# Patient Record
Sex: Female | Born: 1940 | Race: White | Hispanic: No | Smoking: Never smoker
Health system: Southern US, Community
[De-identification: ages and names within clinical notes are randomized; demographics above are authoritative.]

## PROBLEM LIST (undated history)

## (undated) DIAGNOSIS — I10 Essential (primary) hypertension: Secondary | ICD-10-CM

## (undated) DIAGNOSIS — E785 Hyperlipidemia, unspecified: Secondary | ICD-10-CM

## (undated) DIAGNOSIS — K219 Gastro-esophageal reflux disease without esophagitis: Secondary | ICD-10-CM

---

## 2015-09-09 DIAGNOSIS — Z23 Encounter for immunization: Secondary | ICD-10-CM | POA: Diagnosis not present

## 2015-11-14 DIAGNOSIS — K219 Gastro-esophageal reflux disease without esophagitis: Secondary | ICD-10-CM | POA: Diagnosis not present

## 2015-11-14 DIAGNOSIS — Z0000 Encounter for general adult medical examination without abnormal findings: Secondary | ICD-10-CM | POA: Diagnosis not present

## 2015-11-14 DIAGNOSIS — Z79899 Other long term (current) drug therapy: Secondary | ICD-10-CM | POA: Diagnosis not present

## 2015-11-14 DIAGNOSIS — K649 Unspecified hemorrhoids: Secondary | ICD-10-CM | POA: Diagnosis not present

## 2015-11-14 DIAGNOSIS — I10 Essential (primary) hypertension: Secondary | ICD-10-CM | POA: Diagnosis not present

## 2015-11-14 DIAGNOSIS — E785 Hyperlipidemia, unspecified: Secondary | ICD-10-CM | POA: Diagnosis not present

## 2016-01-05 DIAGNOSIS — Z1231 Encounter for screening mammogram for malignant neoplasm of breast: Secondary | ICD-10-CM | POA: Diagnosis not present

## 2016-03-10 DIAGNOSIS — J324 Chronic pansinusitis: Secondary | ICD-10-CM | POA: Diagnosis not present

## 2016-07-30 DIAGNOSIS — Z23 Encounter for immunization: Secondary | ICD-10-CM | POA: Diagnosis not present

## 2016-07-30 DIAGNOSIS — M810 Age-related osteoporosis without current pathological fracture: Secondary | ICD-10-CM | POA: Diagnosis not present

## 2016-07-30 DIAGNOSIS — I10 Essential (primary) hypertension: Secondary | ICD-10-CM | POA: Diagnosis not present

## 2016-07-30 DIAGNOSIS — Z1389 Encounter for screening for other disorder: Secondary | ICD-10-CM | POA: Diagnosis not present

## 2016-07-30 DIAGNOSIS — E785 Hyperlipidemia, unspecified: Secondary | ICD-10-CM | POA: Diagnosis not present

## 2016-07-30 DIAGNOSIS — Z9181 History of falling: Secondary | ICD-10-CM | POA: Diagnosis not present

## 2016-07-30 DIAGNOSIS — Z79899 Other long term (current) drug therapy: Secondary | ICD-10-CM | POA: Diagnosis not present

## 2016-11-02 DIAGNOSIS — R300 Dysuria: Secondary | ICD-10-CM | POA: Diagnosis not present

## 2016-11-02 DIAGNOSIS — K648 Other hemorrhoids: Secondary | ICD-10-CM | POA: Diagnosis not present

## 2016-11-02 DIAGNOSIS — N3091 Cystitis, unspecified with hematuria: Secondary | ICD-10-CM | POA: Diagnosis not present

## 2016-11-02 DIAGNOSIS — N3001 Acute cystitis with hematuria: Secondary | ICD-10-CM | POA: Diagnosis not present

## 2016-11-09 DIAGNOSIS — B9789 Other viral agents as the cause of diseases classified elsewhere: Secondary | ICD-10-CM | POA: Diagnosis not present

## 2016-11-09 DIAGNOSIS — J069 Acute upper respiratory infection, unspecified: Secondary | ICD-10-CM | POA: Diagnosis not present

## 2017-01-15 DIAGNOSIS — E785 Hyperlipidemia, unspecified: Secondary | ICD-10-CM | POA: Diagnosis not present

## 2017-01-15 DIAGNOSIS — Z79899 Other long term (current) drug therapy: Secondary | ICD-10-CM | POA: Diagnosis not present

## 2017-01-15 DIAGNOSIS — H6120 Impacted cerumen, unspecified ear: Secondary | ICD-10-CM | POA: Diagnosis not present

## 2017-01-15 DIAGNOSIS — I10 Essential (primary) hypertension: Secondary | ICD-10-CM | POA: Diagnosis not present

## 2017-01-15 DIAGNOSIS — Z0000 Encounter for general adult medical examination without abnormal findings: Secondary | ICD-10-CM | POA: Diagnosis not present

## 2017-01-18 DIAGNOSIS — Z1231 Encounter for screening mammogram for malignant neoplasm of breast: Secondary | ICD-10-CM | POA: Diagnosis not present

## 2017-03-05 DIAGNOSIS — Z6830 Body mass index (BMI) 30.0-30.9, adult: Secondary | ICD-10-CM | POA: Diagnosis not present

## 2017-03-05 DIAGNOSIS — K649 Unspecified hemorrhoids: Secondary | ICD-10-CM | POA: Diagnosis not present

## 2017-03-05 DIAGNOSIS — R3989 Other symptoms and signs involving the genitourinary system: Secondary | ICD-10-CM | POA: Diagnosis not present

## 2017-05-22 DIAGNOSIS — J329 Chronic sinusitis, unspecified: Secondary | ICD-10-CM | POA: Diagnosis not present

## 2017-05-22 DIAGNOSIS — J40 Bronchitis, not specified as acute or chronic: Secondary | ICD-10-CM | POA: Diagnosis not present

## 2017-05-22 DIAGNOSIS — Z6830 Body mass index (BMI) 30.0-30.9, adult: Secondary | ICD-10-CM | POA: Diagnosis not present

## 2017-06-02 DIAGNOSIS — R05 Cough: Secondary | ICD-10-CM | POA: Diagnosis not present

## 2017-06-02 DIAGNOSIS — L508 Other urticaria: Secondary | ICD-10-CM | POA: Diagnosis not present

## 2017-06-03 DIAGNOSIS — X58XXXA Exposure to other specified factors, initial encounter: Secondary | ICD-10-CM | POA: Diagnosis not present

## 2017-06-03 DIAGNOSIS — T7840XA Allergy, unspecified, initial encounter: Secondary | ICD-10-CM | POA: Diagnosis not present

## 2017-06-03 DIAGNOSIS — L500 Allergic urticaria: Secondary | ICD-10-CM | POA: Diagnosis not present

## 2017-06-04 DIAGNOSIS — L500 Allergic urticaria: Secondary | ICD-10-CM | POA: Diagnosis not present

## 2017-06-10 DIAGNOSIS — Z6831 Body mass index (BMI) 31.0-31.9, adult: Secondary | ICD-10-CM | POA: Diagnosis not present

## 2017-06-10 DIAGNOSIS — L509 Urticaria, unspecified: Secondary | ICD-10-CM | POA: Diagnosis not present

## 2017-08-09 DIAGNOSIS — Z79899 Other long term (current) drug therapy: Secondary | ICD-10-CM | POA: Diagnosis not present

## 2017-08-09 DIAGNOSIS — N39490 Overflow incontinence: Secondary | ICD-10-CM | POA: Diagnosis not present

## 2017-08-09 DIAGNOSIS — K219 Gastro-esophageal reflux disease without esophagitis: Secondary | ICD-10-CM | POA: Diagnosis not present

## 2017-08-09 DIAGNOSIS — Z23 Encounter for immunization: Secondary | ICD-10-CM | POA: Diagnosis not present

## 2017-08-09 DIAGNOSIS — Z6830 Body mass index (BMI) 30.0-30.9, adult: Secondary | ICD-10-CM | POA: Diagnosis not present

## 2017-08-09 DIAGNOSIS — E785 Hyperlipidemia, unspecified: Secondary | ICD-10-CM | POA: Diagnosis not present

## 2017-08-09 DIAGNOSIS — I10 Essential (primary) hypertension: Secondary | ICD-10-CM | POA: Diagnosis not present

## 2017-08-28 DIAGNOSIS — K219 Gastro-esophageal reflux disease without esophagitis: Secondary | ICD-10-CM | POA: Diagnosis not present

## 2017-08-28 DIAGNOSIS — R69 Illness, unspecified: Secondary | ICD-10-CM | POA: Diagnosis not present

## 2017-08-28 DIAGNOSIS — Z6830 Body mass index (BMI) 30.0-30.9, adult: Secondary | ICD-10-CM | POA: Diagnosis not present

## 2017-08-28 DIAGNOSIS — R3989 Other symptoms and signs involving the genitourinary system: Secondary | ICD-10-CM | POA: Diagnosis not present

## 2017-10-08 DIAGNOSIS — N390 Urinary tract infection, site not specified: Secondary | ICD-10-CM | POA: Diagnosis not present

## 2018-01-15 DIAGNOSIS — M1990 Unspecified osteoarthritis, unspecified site: Secondary | ICD-10-CM | POA: Diagnosis not present

## 2018-01-15 DIAGNOSIS — Z6831 Body mass index (BMI) 31.0-31.9, adult: Secondary | ICD-10-CM | POA: Diagnosis not present

## 2018-01-15 DIAGNOSIS — K649 Unspecified hemorrhoids: Secondary | ICD-10-CM | POA: Diagnosis not present

## 2018-01-20 DIAGNOSIS — Z1231 Encounter for screening mammogram for malignant neoplasm of breast: Secondary | ICD-10-CM | POA: Diagnosis not present

## 2018-01-28 DIAGNOSIS — R928 Other abnormal and inconclusive findings on diagnostic imaging of breast: Secondary | ICD-10-CM | POA: Diagnosis not present

## 2018-07-21 DIAGNOSIS — E669 Obesity, unspecified: Secondary | ICD-10-CM | POA: Diagnosis not present

## 2018-07-21 DIAGNOSIS — Z9181 History of falling: Secondary | ICD-10-CM | POA: Diagnosis not present

## 2018-07-21 DIAGNOSIS — Z1339 Encounter for screening examination for other mental health and behavioral disorders: Secondary | ICD-10-CM | POA: Diagnosis not present

## 2018-07-21 DIAGNOSIS — Z6832 Body mass index (BMI) 32.0-32.9, adult: Secondary | ICD-10-CM | POA: Diagnosis not present

## 2018-07-21 DIAGNOSIS — Z23 Encounter for immunization: Secondary | ICD-10-CM | POA: Diagnosis not present

## 2018-07-21 DIAGNOSIS — I10 Essential (primary) hypertension: Secondary | ICD-10-CM | POA: Diagnosis not present

## 2018-07-21 DIAGNOSIS — E785 Hyperlipidemia, unspecified: Secondary | ICD-10-CM | POA: Diagnosis not present

## 2018-07-21 DIAGNOSIS — Z0000 Encounter for general adult medical examination without abnormal findings: Secondary | ICD-10-CM | POA: Diagnosis not present

## 2018-07-21 DIAGNOSIS — Z1331 Encounter for screening for depression: Secondary | ICD-10-CM | POA: Diagnosis not present

## 2018-07-21 DIAGNOSIS — Z79899 Other long term (current) drug therapy: Secondary | ICD-10-CM | POA: Diagnosis not present

## 2018-07-21 DIAGNOSIS — J309 Allergic rhinitis, unspecified: Secondary | ICD-10-CM | POA: Diagnosis not present

## 2018-08-26 DIAGNOSIS — Z6831 Body mass index (BMI) 31.0-31.9, adult: Secondary | ICD-10-CM | POA: Diagnosis not present

## 2018-08-26 DIAGNOSIS — N390 Urinary tract infection, site not specified: Secondary | ICD-10-CM | POA: Diagnosis not present

## 2019-02-16 ENCOUNTER — Other Ambulatory Visit: Payer: Self-pay

## 2019-02-16 ENCOUNTER — Emergency Department (HOSPITAL_COMMUNITY)
Admission: EM | Admit: 2019-02-16 | Discharge: 2019-02-17 | Disposition: A | Discharge status: Home / Self Care | Payer: Medicare HMO | Attending: Emergency Medicine | Admitting: Emergency Medicine

## 2019-02-16 ENCOUNTER — Emergency Department (HOSPITAL_COMMUNITY): Payer: Medicare HMO

## 2019-02-16 ENCOUNTER — Encounter (HOSPITAL_COMMUNITY): Payer: Self-pay | Admitting: Emergency Medicine

## 2019-02-16 DIAGNOSIS — R0602 Shortness of breath: Secondary | ICD-10-CM | POA: Diagnosis not present

## 2019-02-16 DIAGNOSIS — I10 Essential (primary) hypertension: Secondary | ICD-10-CM | POA: Diagnosis not present

## 2019-02-16 DIAGNOSIS — Z7982 Long term (current) use of aspirin: Secondary | ICD-10-CM | POA: Insufficient documentation

## 2019-02-16 DIAGNOSIS — N3000 Acute cystitis without hematuria: Secondary | ICD-10-CM | POA: Diagnosis not present

## 2019-02-16 DIAGNOSIS — Z79899 Other long term (current) drug therapy: Secondary | ICD-10-CM | POA: Insufficient documentation

## 2019-02-16 HISTORY — DX: Hyperlipidemia, unspecified: E78.5

## 2019-02-16 HISTORY — DX: Gastro-esophageal reflux disease without esophagitis: K21.9

## 2019-02-16 HISTORY — DX: Essential (primary) hypertension: I10

## 2019-02-16 LAB — BASIC METABOLIC PANEL
Anion gap: 13 (ref 5–15)
BUN: 11 mg/dL (ref 8–23)
CO2: 22 mmol/L (ref 22–32)
Calcium: 9.6 mg/dL (ref 8.9–10.3)
Chloride: 102 mmol/L (ref 98–111)
Creatinine, Ser: 0.8 mg/dL (ref 0.44–1.00)
GFR calc Af Amer: >60 mL/min (ref >60)
GFR calc non Af Amer: >60 mL/min (ref >60)
Glucose, Bld: 137 mg/dL — ABNORMAL HIGH (ref 70–99)
Potassium: 3.7 mmol/L (ref 3.5–5.1)
Sodium: 137 mmol/L (ref 135–145)

## 2019-02-16 LAB — CBC
HCT: 45.4 % (ref 36.0–46.0)
Hemoglobin: 14.2 g/dL (ref 12.0–15.0)
MCH: 27.7 pg (ref 26.0–34.0)
MCHC: 31.3 g/dL (ref 30.0–36.0)
MCV: 88.7 fL (ref 80.0–100.0)
Platelets: 228 10*3/uL (ref 150–400)
RBC: 5.12 MIL/uL — ABNORMAL HIGH (ref 3.87–5.11)
RDW: 13 % (ref 11.5–15.5)
WBC: 11.1 10*3/uL — ABNORMAL HIGH (ref 4.0–10.5)
nRBC: 0 % (ref 0.0–0.2)

## 2019-02-16 LAB — TROPONIN I: Troponin I: <0.03 ng/mL (ref <0.03)

## 2019-02-16 MED ORDER — SODIUM CHLORIDE 0.9% FLUSH: 3.0000 mL | Freq: Once | INTRAVENOUS | Status: DC

## 2019-02-16 NOTE — ED Notes (Signed)
Patient transported to X-ray 

## 2019-02-16 NOTE — ED Provider Notes (Signed)
[place] EMERGENCY DEPARTMENT Provider Note   CSN: [id number] Arrival date & time: 02/16/19  2224    History   Chief Complaint Chief Complaint  Patient presents with  . Shortness of Breath    HPI Diana Aguilar is a 78 y.o. female with a h/o of HTN, HLD, and GERD who presents to the Emergency Department with a chief complaint of shortness of breath.   The patient reports she developed shortness of breath around 15:00 that lasted for several hours. Dyspnea began while she was sitting on the couch. No known aggravating or alleviating factors. She reports the shortness of breath has resolved. She reports she had nausea that began around 11:00 and lasted for a couple of hours before resolving spontaneously without treatment. She reports she has been having some "discomfort" in her back that began either last night or sometime this morning. She is unable to characterize the pain. Pain is non-radiating. No known aggravating or alleviating factors.   She reports she lives in [place] with her son that works at [employer], but has been staying with her son in [place] during the COVID-19 pandemic.    The patient's son, Diana Aguilar, reports his mother has been living with her for the last 4 weeks. He works in [employer], applications and has been working from home for the last 4 weeks. He reports he has checked her temperature daily for the last month, which has been 99 F.   He reports that his mother told him that she was feeling nauseated around 17:50. He reports that he gave her some OTC nausea medication recommended by the pharmacist and her nausea resolved. He reports that at 22:00 she began endorsing shortness of breath and states "I'm having trouble breathing." He also reports she was endorsing pain in her back and shoulder. He checked her pulse, which was elevated. He also noted her color "looked bad." He reports he was concerned because she never complains.   She quit smoking 30  years ago. She does not use alcohol. He reports she saw a cardiologist ~10 years ago after going to her PCP and had a normal EKG. She was sent home with NTG and follow up to cardiology. She followed up with cardiology and had a normal work up and abnormal EKG was presumed to be due to incorrect lead placement. Her son reports that the patient's sisters have CVD. He reports that the patient was being worked up for dementia, but her appointment was cancelled due to COVID-19.       The history is provided by the patient. No language interpreter was used.    Past Medical History:  Diagnosis Date  . GERD (gastroesophageal reflux disease)   . Hyperlipidemia   . Hypertension     There are no active problems to display for this patient.   History reviewed. No pertinent surgical history.   OB History   No obstetric history on file.      Home Medications    Prior to Admission medications   Medication Sig Start Date End Date Taking? Authorizing Provider  amLODipine (NORVASC) 5 MG tablet Take 5 mg by mouth daily. 01/30/19  Yes [provider]  aspirin EC 81 MG tablet Take 81 mg by mouth daily.   Yes [provider]  benazepril (LOTENSIN) 40 MG tablet Take 40 mg by mouth daily. 01/30/19  Yes [provider]  omega-3 acid ethyl esters (LOVAZA) 1 g capsule Take 2 g by mouth daily.  Yes [provider]  omeprazole (PRILOSEC) 20 MG capsule Take 20 mg by mouth daily. 02/05/19  Yes [provider]  pravastatin (PRAVACHOL) 20 MG tablet Take 20 mg by mouth every morning. 01/25/19  Yes [provider]  cephALEXin (KEFLEX) 500 MG capsule Take 1 capsule (500 mg total) by mouth 2 (two) times daily for 5 days. 02/17/19 02/22/19  McDonald, Mia A, PA-C  ondansetron (ZOFRAN ODT) 4 MG disintegrating tablet Take 1 tablet (4 mg total) by mouth every 8 (eight) hours as needed. 02/17/19   McDonald, Mia A, PA-C    Family History No family history on file.   Social History Social History   Tobacco Use  . Smoking status: Never Smoker  . Smokeless tobacco: Never Used  Substance Use Topics  . Alcohol use: Never    Frequency: Never  . Drug use: Never     Allergies   Sulfa antibiotics   Review of Systems Review of Systems  Constitutional: Negative for activity change, chills, fatigue and fever.  HENT: Negative for congestion, rhinorrhea and sore throat.   Eyes: Negative for visual disturbance.  Respiratory: Positive for shortness of breath. Negative for cough and wheezing.   Cardiovascular: Negative for chest pain.  Gastrointestinal: Positive for nausea. Negative for abdominal pain, diarrhea and vomiting.  Genitourinary: Negative for dysuria, frequency and urgency.  Musculoskeletal: Positive for back pain. Negative for myalgias, neck pain and neck stiffness.  Skin: Negative for rash.  Allergic/Immunologic: Negative for immunocompromised state.  Neurological: Negative for dizziness, weakness, light-headedness and headaches.  Psychiatric/Behavioral: Negative for confusion.   Physical Exam Updated Vital Signs BP (!) 157/85   Pulse (!) 126   Temp 98.7 F (37.1 C) (Rectal)   Resp (!) 21   SpO2 98%   Physical Exam Vitals signs and nursing note reviewed.  Constitutional:      General: She is not in acute distress. HENT:     Head: Normocephalic.  Eyes:     Conjunctiva/sclera: Conjunctivae normal.  Neck:     Musculoskeletal: Neck supple.  Cardiovascular:     Rate and Rhythm: Normal rate and regular rhythm.     Heart sounds: No murmur. No friction rub. No gallop.   Pulmonary:     Effort: Pulmonary effort is normal. No tachypnea, bradypnea, accessory muscle usage or respiratory distress.     Breath sounds: No stridor. No decreased breath sounds, wheezing, rhonchi or rales.     Comments: No increased work of breathing.  Chest:     Chest wall: No deformity, tenderness or edema.  Abdominal:     General: There is no distension.      Palpations: Abdomen is soft. There is no mass.     Tenderness: There is no abdominal tenderness. There is no right CVA tenderness, left CVA tenderness, guarding or rebound.     Hernia: No hernia is present.     Comments: Abdomen is soft, nontender, nondistended. No rebound or guarding. No CVA tenderness bilaterally.   Musculoskeletal:     Right lower leg: She exhibits no tenderness. No edema.     Left lower leg: She exhibits no tenderness. No edema.     Comments: No tenderness to the cervical, thoracic, or lumbar spinous processes or bilateral paraspinal muscles.  No crepitus or step-offs. No rashes or wounds.   Skin:    General: Skin is warm.     Findings: No rash.  Neurological:     Mental Status: She is alert.  Psychiatric:  Behavior: Behavior normal.      ED Treatments / Results  Labs (all labs ordered are listed, but only abnormal results are displayed) Labs Reviewed  BASIC METABOLIC PANEL - Abnormal; Notable for the following components:      Result Value   Glucose, Bld 137 (*)    All other components within normal limits  CBC - Abnormal; Notable for the following components:   WBC 11.1 (*)    RBC 5.12 (*)    All other components within normal limits  URINALYSIS, ROUTINE W REFLEX MICROSCOPIC - Abnormal; Notable for the following components:   APPearance HAZY (*)    Leukocytes,Ua MODERATE (*)    Bacteria, UA FEW (*)    All other components within normal limits  URINE CULTURE  TROPONIN I  HEPATIC FUNCTION PANEL  D-DIMER, QUANTITATIVE (NOT AT [facility])  TROPONIN I    EKG EKG Interpretation  Date/Time:  Monday February 16 2019 21:32:52 EDT Ventricular Rate:  122 PR Interval:  126 QRS Duration: 76 QT Interval:  322 QTC Calculation: 458 R Axis:   74 Text Interpretation:  Sinus tachycardia ST & T wave abnormality, consider inferior ischemia Abnormal ECG No old tracing to compare Confirmed by Ward, Cyril Mourning [phone]) on 02/16/2019 11:32:41 PM   Radiology Dg Chest  2 View  Result Date: 02/16/2019 CLINICAL DATA:  Increasing shortness of breath for several hours EXAM: CHEST - 2 VIEW COMPARISON:  08/04/2012 FINDINGS: Cardiac shadow is within normal limits. Aortic calcifications are again seen. The lungs are well aerated bilaterally. No focal infiltrate or sizable effusion is seen. No acute bony abnormality is noted. IMPRESSION: No active cardiopulmonary disease. Electronically Signed   By: Inez Catalina M.D.   On: 02/16/2019 23:15    Procedures Procedures (including critical care time)  Medications Ordered in ED Medications - No data to display   Initial Impression / Assessment and Plan / ED Course  I have reviewed the triage vital signs and the nursing notes.  Pertinent labs & imaging results that were available during my care of the patient were reviewed by me and considered in my medical decision making (see chart for details).  78 year old female with a history of hypertension, hyperlipidemia, and GERD presenting with shortness of breath, nausea, and back pain.  On exam, the patient has no increased work of breathing, accessory muscle use, or tachypnea.  SaO2 is in the high 90s with good waveform on the monitor.  She is tachycardic in the 120s, but vital signs are otherwise reassuring.  No fever with rectal temp.  The patient was discussed and evaluated with Dr. Leonides Schanz, attending physician.  Will order a chest x-ray, labs, EKG, urinalysis, and reassess.  EKG with sinus tachycardia.  Initial troponin is negative.  CBC with mild leukocytosis.  Urinalysis is concerning for infection.  Urine culture sent.  Electrolytes are reassuring.  Chest x-ray is unremarkable.  D-dimer was negative.  Clinical Course as of Feb 16 738  Tue Feb 17, 2019  0104 Notified by nursing staff that the patient was requesting to leave because "no one had been in the room to check on her."  Return to the patient's room to reevaluate her for the third time.  Reminded her that both  myself and Dr. Leonides Schanz have been checking on her.  The patient's work-up thus far was reviewed with the patient.  Discussed with the patient that her back pain may be due to a UTI and a urine culture had been sent.  We will  plan to discharge her with antibiotics.  Discussed that we are waiting on a repeat troponin that have previously been discussed with her son Diana Aguilar and encouraged her to stay for repeat troponin.  She is agreeable to the plan at this time.  Heart rate has improved to low 100s without treatment. BP has also improved to 130s/70s without treatment.    [MM]    Clinical Course User Index [MM] McDonald, Mia A, PA-C    The patient's work-up was discussed with her son, Diana Aguilar, after receiving permission from the patient.  Low suspicion for ACS, PE, pneumothorax, esophageal rupture, myocarditis, pericarditis, cholecystitis, or pneumonia. Although heart rate improved, it did elevate into the 110s when the patient stood and was waiting at the door of her room prior to discharge.  I offered the patient an IV fluid bolus as I suspect she may have some mild dehydration although she has not endorsed any dizziness or lightheadedness, but she declined.  She is tolerating p.o. fluids so it is reasonable for her to fluid resuscitate at home.  There also appears to be a component of anxiety state related to her elevated heart rate as she has adamantly expressed that she gets anxious when going to the ER or to a healthcare appointment.  The patient continues to remain symptom-free.  Will discharge with Keflex for UTI.  Strict return precautions given.  She is hemodynamically stable and in no acute distress.  Safe for discharge home with outpatient follow-up this time.     Final Clinical Impressions(s) / ED Diagnoses   Final diagnoses:  Acute cystitis without hematuria  Shortness of breath    ED Discharge Orders         Ordered    cephALEXin (KEFLEX) 500 MG capsule  2 times daily     02/17/19 0210     ondansetron (ZOFRAN ODT) 4 MG disintegrating tablet  Every 8 hours PRN     02/17/19 0216           McDonald, Mia A, PA-C 02/17/19 0739    Ward, Delice Bison, DO 02/17/19 2323

## 2019-02-16 NOTE — ED Triage Notes (Signed)
Patient with increased shortness of breath that started 3pm this afternoon.  She denies any chest pain, nausea or vomiting, no fevers per patient.

## 2019-02-17 DIAGNOSIS — Z7982 Long term (current) use of aspirin: Secondary | ICD-10-CM | POA: Diagnosis not present

## 2019-02-17 DIAGNOSIS — I10 Essential (primary) hypertension: Secondary | ICD-10-CM | POA: Diagnosis not present

## 2019-02-17 DIAGNOSIS — N3000 Acute cystitis without hematuria: Secondary | ICD-10-CM | POA: Diagnosis not present

## 2019-02-17 DIAGNOSIS — Z79899 Other long term (current) drug therapy: Secondary | ICD-10-CM | POA: Diagnosis not present

## 2019-02-17 LAB — URINALYSIS, ROUTINE W REFLEX MICROSCOPIC
Bilirubin Urine: NEGATIVE
Glucose, UA: NEGATIVE mg/dL
Hgb urine dipstick: NEGATIVE
Ketones, ur: NEGATIVE mg/dL
Nitrite: NEGATIVE
Protein, ur: NEGATIVE mg/dL
Specific Gravity, Urine: 1.018 (ref 1.005–1.030)
pH: 6 (ref 5.0–8.0)

## 2019-02-17 LAB — HEPATIC FUNCTION PANEL
ALT: 21 U/L (ref 0–44)
AST: 27 U/L (ref 15–41)
Albumin: 4.4 g/dL (ref 3.5–5.0)
Alkaline Phosphatase: 99 U/L (ref 38–126)
Bilirubin, Direct: 0.1 mg/dL (ref 0.0–0.2)
Indirect Bilirubin: 0.5 mg/dL (ref 0.3–0.9)
Total Bilirubin: 0.6 mg/dL (ref 0.3–1.2)
Total Protein: 8.1 g/dL (ref 6.5–8.1)

## 2019-02-17 LAB — D-DIMER, QUANTITATIVE: D-Dimer, Quant: 0.43 ug/mL-FEU (ref 0.00–0.50)

## 2019-02-17 LAB — TROPONIN I: Troponin I: <0.03 ng/mL (ref <0.03)

## 2019-02-17 MED ORDER — ONDANSETRON 4 MG PO TBDP: 4.0000 mg | ORAL_TABLET | Freq: Three times a day (TID) | ORAL | 0 refills | Status: AC | PRN

## 2019-02-17 MED ORDER — CEPHALEXIN 500 MG PO CAPS: 500.0000 mg | ORAL_CAPSULE | Freq: Two times a day (BID) | ORAL | 0 refills | Status: AC

## 2019-02-17 NOTE — Discharge Instructions (Addendum)
Thank you for allowing me to care for you today in the Emergency Department.   Your work-up today for shortness of breath was reassuring.  Your EKG showed that your heart rate was fast and this resolved in the ER without treatment.  Your oxygen saturation stayed at 98 to 99% in the ER. Use the activation code to download My Chart to review your results and share with your primary care provider as needed.   Your chest x-ray was negative.  Your labs were concerning for a urinary tract infection.  I have given you a prescription for Keflex.  Take this antibiotic by mouth 2 times daily for the next 5 days.  Your urine has been sent to the lab for culture.  This could explain the back pain and nausea that you have been having.  If your nausea returns, you can let 1 tablet of Zofran dissolve in your tongue every 8 hours as needed for nausea or vomiting.  If you develop shortness of breath at home with wheezing you can try taking 2 puffs of an albuterol inhaler once every 4 hours.   Please follow-up with your primary care provider within the next week regarding your visit to the ER.  Although your work-up is reassuring today, if you develop new or worsening symptoms including worsening shortness of breath, respiratory distress, shortness of breath with chest pain, neck pain, or pain radiating down the left arm, sweating, a bad cough, fever, significant swelling in her legs, or other new concerning symptoms, please return to the emergency department for re-evaluation.

## 2019-02-17 NOTE — ED Notes (Signed)
Pt requesting beverage and requesting to be discharged.

## 2019-02-18 LAB — URINE CULTURE

## 2019-05-07 DIAGNOSIS — Z6832 Body mass index (BMI) 32.0-32.9, adult: Secondary | ICD-10-CM | POA: Diagnosis not present

## 2019-05-07 DIAGNOSIS — R69 Illness, unspecified: Secondary | ICD-10-CM | POA: Diagnosis not present

## 2019-06-18 ENCOUNTER — Emergency Department (HOSPITAL_COMMUNITY): Payer: Medicare HMO

## 2019-06-18 ENCOUNTER — Other Ambulatory Visit: Payer: Self-pay

## 2019-06-18 ENCOUNTER — Emergency Department (HOSPITAL_COMMUNITY)
Admission: EM | Admit: 2019-06-18 | Discharge: 2019-06-18 | Disposition: A | Discharge status: Home / Self Care | Payer: Medicare HMO | Attending: Emergency Medicine | Admitting: Emergency Medicine

## 2019-06-18 DIAGNOSIS — S199XXA Unspecified injury of neck, initial encounter: Secondary | ICD-10-CM | POA: Diagnosis not present

## 2019-06-18 DIAGNOSIS — Z79899 Other long term (current) drug therapy: Secondary | ICD-10-CM | POA: Diagnosis not present

## 2019-06-18 DIAGNOSIS — I10 Essential (primary) hypertension: Secondary | ICD-10-CM | POA: Diagnosis not present

## 2019-06-18 DIAGNOSIS — S0003XA Contusion of scalp, initial encounter: Secondary | ICD-10-CM | POA: Insufficient documentation

## 2019-06-18 DIAGNOSIS — Y9301 Activity, walking, marching and hiking: Secondary | ICD-10-CM | POA: Insufficient documentation

## 2019-06-18 DIAGNOSIS — Y92009 Unspecified place in unspecified non-institutional (private) residence as the place of occurrence of the external cause: Secondary | ICD-10-CM | POA: Insufficient documentation

## 2019-06-18 DIAGNOSIS — G3184 Mild cognitive impairment, so stated: Secondary | ICD-10-CM | POA: Diagnosis not present

## 2019-06-18 DIAGNOSIS — Y999 Unspecified external cause status: Secondary | ICD-10-CM | POA: Diagnosis not present

## 2019-06-18 DIAGNOSIS — S50311A Abrasion of right elbow, initial encounter: Secondary | ICD-10-CM | POA: Diagnosis not present

## 2019-06-18 DIAGNOSIS — W109XXA Fall (on) (from) unspecified stairs and steps, initial encounter: Secondary | ICD-10-CM | POA: Insufficient documentation

## 2019-06-18 DIAGNOSIS — Z87891 Personal history of nicotine dependence: Secondary | ICD-10-CM | POA: Diagnosis not present

## 2019-06-18 DIAGNOSIS — Z7982 Long term (current) use of aspirin: Secondary | ICD-10-CM | POA: Insufficient documentation

## 2019-06-18 DIAGNOSIS — S0101XA Laceration without foreign body of scalp, initial encounter: Secondary | ICD-10-CM | POA: Diagnosis not present

## 2019-06-18 NOTE — ED Notes (Signed)
Pa  at bedside. 

## 2019-06-18 NOTE — ED Triage Notes (Signed)
Pt reports tripping when going down her front steps. Pt reports falling and hitting her head on concrete. Bleeding controlled to rt side of head approx 2cm laceration to area. Pt alert and oriented denies LOC. Fall was from 1 step.

## 2019-06-18 NOTE — ED Provider Notes (Signed)
[facility] EMERGENCY DEPARTMENT Provider Note   CSN: [id number] Arrival date & time: 06/18/19  1233    History   Chief Complaint Chief Complaint  Patient presents with   Fall    HPI Diana Aguilar is a 78 y.o. female with PMH HTN, HLD, GERD.  History provided by patient and her daughter.  Patient fell going up the front step at her son's home today around 12pm. She notes that she hit the right side of her head on the concrete.  Notes that she did not lose consciousness.  Denies pain in any other areas.  Denies changes in vision, headache.  States that she was able to ambulate into the emergency room today without difficulty.  Prior to fall, patient was in her normal state of health and without complaints.   Past Medical History:  Diagnosis Date   GERD (gastroesophageal reflux disease)    Hyperlipidemia    Hypertension     There are no active problems to display for this patient.   No past surgical history on file.   OB History   No obstetric history on file.      Home Medications    Prior to Admission medications   Medication Sig Start Date End Date Taking? Authorizing Provider  amLODipine (NORVASC) 5 MG tablet Take 5 mg by mouth daily. 01/30/19   [provider]  aspirin EC 81 MG tablet Take 81 mg by mouth daily.    [provider]  benazepril (LOTENSIN) 40 MG tablet Take 40 mg by mouth daily. 01/30/19   [provider]  omega-3 acid ethyl esters (LOVAZA) 1 g capsule Take 2 g by mouth daily.    [provider]  omeprazole (PRILOSEC) 20 MG capsule Take 20 mg by mouth daily. 02/05/19   [provider]  ondansetron (ZOFRAN ODT) 4 MG disintegrating tablet Take 1 tablet (4 mg total) by mouth every 8 (eight) hours as needed. 02/17/19   McDonald, Mia A, PA-C  pravastatin (PRAVACHOL) 20 MG tablet Take 20 mg by mouth every morning. 01/25/19   [provider]    Family History No family history on  file.  Social History Social History   Tobacco Use   Smoking status: Never Smoker   Smokeless tobacco: Never Used  Substance Use Topics   Alcohol use: Never    Frequency: Never   Drug use: Never     Allergies   Sulfa antibiotics   Review of Systems Review of Systems  Constitutional: Negative for activity change, chills, fatigue and fever.  HENT: Negative for congestion and sore throat.   Eyes: Negative for visual disturbance.  Respiratory: Negative for cough and shortness of breath.   Cardiovascular: Negative for chest pain and leg swelling.  Gastrointestinal: Negative for abdominal pain, blood in stool, constipation, diarrhea, nausea and vomiting.  Genitourinary: Negative for difficulty urinating, dysuria and enuresis.  Musculoskeletal: Negative for back pain, neck pain and neck stiffness.  Skin: Positive for wound.  Neurological: Negative for dizziness, syncope, weakness and headaches.  Psychiatric/Behavioral: Negative for confusion.     Physical Exam Updated Vital Signs BP 138/66 (BP Location: Right Arm)    Pulse 81    Temp 98.1 F (36.7 C) (Oral)    Resp 18    SpO2 99%   Physical Exam Constitutional:      General: She is not in acute distress.    Appearance: Normal appearance. She is not ill-appearing.  HENT:     Head:  Eyes:     Extraocular Movements: Extraocular movements intact.     Conjunctiva/sclera: Conjunctivae normal.     Pupils: Pupils are equal, round, and reactive to light.  Neck:     Musculoskeletal: Normal range of motion and neck supple. No neck rigidity or muscular tenderness.  Cardiovascular:     Rate and Rhythm: Normal rate and regular rhythm.     Pulses: Normal pulses.     Heart sounds: No murmur. No friction rub. No gallop.   Pulmonary:     Effort: Pulmonary effort is normal. No respiratory distress.     Breath sounds: Normal breath sounds. No wheezing, rhonchi or rales.  Abdominal:     General: Abdomen is flat. Bowel sounds are  normal. There is no distension.     Palpations: Abdomen is soft.     Tenderness: There is no abdominal tenderness.  Musculoskeletal: Normal range of motion.        General: No swelling or tenderness.     Right lower leg: No edema.     Left lower leg: No edema.     Comments: Full strength BUE/BLE, small abrasion on right elbow, no TTP of all BUE/BLE  Skin:    General: Skin is warm and dry.  Neurological:     General: No focal deficit present.     Mental Status: She is alert and oriented to person, place, and time. Mental status is at baseline.     Cranial Nerves: No cranial nerve deficit.     Sensory: No sensory deficit.     Motor: No weakness.     Gait: Gait normal.     Comments: Sensation intact throughout  Psychiatric:        Mood and Affect: Mood normal.        Behavior: Behavior normal.      ED Treatments / Results  Labs (all labs ordered are listed, but only abnormal results are displayed) Labs Reviewed - No data to display  EKG None  Radiology Ct Head Wo Contrast  Result Date: 06/18/2019 CLINICAL DATA:  Trip with fall down front steps, head strike to concrete right scalp laceration EXAM: CT HEAD WITHOUT CONTRAST CT CERVICAL SPINE WITHOUT CONTRAST TECHNIQUE: Multidetector CT imaging of the head and cervical spine was performed following the standard protocol without intravenous contrast. Multiplanar CT image reconstructions of the cervical spine were also generated. COMPARISON:  None. FINDINGS: CT HEAD FINDINGS Brain: No evidence of acute infarction, hemorrhage, hydrocephalus, extra-axial collection.There is a focally sclerotic appearing lesion arising from the inner table of the squamosal portion of the right temporal bone which exerts minimal mass effect on the adjacent brain. Vascular: Atherosclerotic calcification of the carotid siphons and intradural vertebral arteries. Skull: There is focal scalp swelling and laceration with subgaleal hematoma measuring up to 6 mm in  thickness overlying the right frontoparietal scalp without subjacent calvarial fracture. Appearance is suggestive of a meningioma. Background of hyperostosis frontalis interna within the calvaria Sinuses/Orbits: Paranasal sinuses and mastoid air cells are predominantly clear. Orbital structures are unremarkable. Other: None. CT CERVICAL SPINE FINDINGS Alignment: Exaggeration of the normal cervical lordosis. Degenerative anterolisthesis of C4 on C5, C6 on C7 and C7 on T1. Skull base and vertebrae: No acute fracture. No primary bone lesion or focal pathologic process. Vertebral body heights are maintained. Soft tissues and spinal canal: No pre or paravertebral fluid or swelling. No visible canal hematoma. Disc levels: Diffuse intervertebral disc height loss is present through the cervical spine. There resulting cervical  spondylitic endplate changes and hypertrophic facet degeneration. Posterior disc osteophyte complexes are present at C3-4, C5-6 and C6-7 which efface the ventral thecal sac. Osteophyte complex at C5-6 results in at least mild spinal canal stenosis. No other significant spinal canal stenosis. There is severe left and moderate right foraminal stenosis at C3-4 with additional mild-to-moderate foraminal stenoses are noted the remaining cervical levels. Upper chest: Mild biapical pleuroparenchymal scarring. No acute abnormality in the upper chest or imaged lung apices. Other: Vascular calcifications of the great vessels and within the carotid bifurcations. IMPRESSION: 1. No acute intracranial abnormality. 2. Focal scalp swelling and laceration with subgaleal hematoma overlying the right frontoparietal scalp without subjacent calvarial fracture. 3. Fully calcified lesion along the inner table of the squamosal portion of the right temporal bone likely reflects a calcified meningioma with minimal local mass effect. Findings most compatible with a meningioma. 4. No acute cervical spine fracture. 5. Multilevel  degenerative changes through the cervical spine as described above. Maximal changes at C3-4 with severe left neural foraminal stenosis. Electronically Signed   By: Lovena Le M.D.   On: 06/18/2019 15:49   Ct Cervical Spine Wo Contrast  Result Date: 06/18/2019 CLINICAL DATA:  Trip with fall down front steps, head strike to concrete right scalp laceration EXAM: CT HEAD WITHOUT CONTRAST CT CERVICAL SPINE WITHOUT CONTRAST TECHNIQUE: Multidetector CT imaging of the head and cervical spine was performed following the standard protocol without intravenous contrast. Multiplanar CT image reconstructions of the cervical spine were also generated. COMPARISON:  None. FINDINGS: CT HEAD FINDINGS Brain: No evidence of acute infarction, hemorrhage, hydrocephalus, extra-axial collection.There is a focally sclerotic appearing lesion arising from the inner table of the squamosal portion of the right temporal bone which exerts minimal mass effect on the adjacent brain. Vascular: Atherosclerotic calcification of the carotid siphons and intradural vertebral arteries. Skull: There is focal scalp swelling and laceration with subgaleal hematoma measuring up to 6 mm in thickness overlying the right frontoparietal scalp without subjacent calvarial fracture. Appearance is suggestive of a meningioma. Background of hyperostosis frontalis interna within the calvaria Sinuses/Orbits: Paranasal sinuses and mastoid air cells are predominantly clear. Orbital structures are unremarkable. Other: None. CT CERVICAL SPINE FINDINGS Alignment: Exaggeration of the normal cervical lordosis. Degenerative anterolisthesis of C4 on C5, C6 on C7 and C7 on T1. Skull base and vertebrae: No acute fracture. No primary bone lesion or focal pathologic process. Vertebral body heights are maintained. Soft tissues and spinal canal: No pre or paravertebral fluid or swelling. No visible canal hematoma. Disc levels: Diffuse intervertebral disc height loss is present  through the cervical spine. There resulting cervical spondylitic endplate changes and hypertrophic facet degeneration. Posterior disc osteophyte complexes are present at C3-4, C5-6 and C6-7 which efface the ventral thecal sac. Osteophyte complex at C5-6 results in at least mild spinal canal stenosis. No other significant spinal canal stenosis. There is severe left and moderate right foraminal stenosis at C3-4 with additional mild-to-moderate foraminal stenoses are noted the remaining cervical levels. Upper chest: Mild biapical pleuroparenchymal scarring. No acute abnormality in the upper chest or imaged lung apices. Other: Vascular calcifications of the great vessels and within the carotid bifurcations. IMPRESSION: 1. No acute intracranial abnormality. 2. Focal scalp swelling and laceration with subgaleal hematoma overlying the right frontoparietal scalp without subjacent calvarial fracture. 3. Fully calcified lesion along the inner table of the squamosal portion of the right temporal bone likely reflects a calcified meningioma with minimal local mass effect. Findings most compatible with a meningioma.  4. No acute cervical spine fracture. 5. Multilevel degenerative changes through the cervical spine as described above. Maximal changes at C3-4 with severe left neural foraminal stenosis. Electronically Signed   By: Lovena Le M.D.   On: 06/18/2019 15:49    Procedures .Marland KitchenLaceration Repair  Date/Time: 06/18/2019 4:44 PM Performed by: Cleophas Dunker, DO Authorized by: Drenda Freeze, MD   Consent:    Consent obtained:  Verbal   Consent given by:  Patient   Risks discussed:  Infection, pain, poor wound healing and need for additional repair   Alternatives discussed:  No treatment Anesthesia (see MAR for exact dosages):    Anesthesia method:  None Laceration details:    Location:  Scalp   Scalp location:  R parietal   Length (cm):  1.5   Depth (mm):  3 Repair type:    Repair type:   Simple Pre-procedure details:    Preparation:  Patient was prepped and draped in usual sterile fashion Exploration:    Wound exploration: wound explored through full range of motion     Wound extent: no fascia violation noted, no foreign bodies/material noted, no muscle damage noted and no underlying fracture noted     Contaminated: no   Treatment:    Area cleansed with:  Saline and Betadine   Amount of cleaning:  Standard   Irrigation solution:  Sterile saline   Irrigation volume:  15 cc   Irrigation method:  Syringe Skin repair:    Repair method:  Staples   Number of staples:  2 Approximation:    Approximation:  Close Post-procedure details:    Dressing:  Open (no dressing)   Patient tolerance of procedure:  Tolerated well, no immediate complications   (including critical care time)  Medications Ordered in ED Medications - No data to display   Initial Impression / Assessment and Plan / ED Course  I have reviewed the triage vital signs and the nursing notes.  Pertinent labs & imaging results that were available during my care of the patient were reviewed by me and considered in my medical decision making (see chart for details).  Apollonia Amini is an 78 yo female with PMH HTN. HLD, GERD, who presents after mechanical fall with right 1.5 cm head laceration and underlying hematoma.  Vital signs are stable, she is neurologically intact on exam.  She has no evidence of injury to other locations that requires further imaging and was without complaint at present.  Given 1.5 cm head laceration, placed to staples to prevent further opening and bleeding.  CT head without evidence of intracranial hemorrhage.  Did note meningioma, patient and her daughter made aware of this diagnosis and advised to follow-up with neurosurgery.  Referral placed on discharge.  Given she has otherwise been in good health, history is consistent with mechanical fall, and she is neurologically intact, no further  work-up indicated.  Return precautions discussed with patient and her daughter, they voiced understanding.  Advised to follow-up in 1 week with her PCP for staple removal.  Final Clinical Impressions(s) / ED Diagnoses   Final diagnoses:  Laceration of scalp without foreign body, initial encounter    ED Discharge Orders         Ordered    Ambulatory referral to Neurosurgery     06/18/19 [address] DO 06/18/19 1646    Drenda Freeze, MD 06/20/19 [phone]

## 2019-06-18 NOTE — Discharge Instructions (Signed)
You were seen in the hospital for a fall and head laceration.  2 staples were placed in your head.  You should see your primary doctor in 1 week to have these removed.  Keep the area clean and dry.  Use the information below.  There was also an area concerning for a meningioma on your CT scan.  We will have you follow-up with neurosurgery to see if anything else needs to be done.  Please ensure that you call them to make an appointment.

## 2019-06-18 NOTE — ED Notes (Signed)
Patient transported to CT 

## 2019-06-25 ENCOUNTER — Other Ambulatory Visit: Payer: Self-pay

## 2019-06-25 ENCOUNTER — Encounter (HOSPITAL_COMMUNITY): Payer: Self-pay | Admitting: *Deleted

## 2019-06-25 ENCOUNTER — Emergency Department (HOSPITAL_COMMUNITY)
Admission: EM | Admit: 2019-06-25 | Discharge: 2019-06-25 | Disposition: A | Discharge status: Home / Self Care | Payer: Medicare HMO | Attending: Emergency Medicine | Admitting: Emergency Medicine

## 2019-06-25 DIAGNOSIS — S0181XD Laceration without foreign body of other part of head, subsequent encounter: Secondary | ICD-10-CM | POA: Diagnosis not present

## 2019-06-25 DIAGNOSIS — Z4802 Encounter for removal of sutures: Secondary | ICD-10-CM | POA: Diagnosis not present

## 2019-06-25 NOTE — ED Triage Notes (Signed)
Pt needs staple removal (2) on her head, denies any issues with staples

## 2019-06-25 NOTE — ED Provider Notes (Signed)
[facility] EMERGENCY DEPARTMENT Provider Note   CSN: [id number] Arrival date & time: 06/25/19  2002     History   Chief Complaint Chief Complaint  Patient presents with  . Suture / Staple Removal    HPI Diana Aguilar is a 78 y.o. female.     78 y.o female with a PMH of GERD, HTN presents to the ED with request of staple removal.  Patient had a fall about a week ago, had a unremarkable work-up while in the ED.  Received 2 staples to her head, she now returns for removal.  Patient reports no issues with the wound.  Denies any fevers, erythema, drainage from the wound.  The history is provided by the patient and a relative.  Suture / Staple Removal Pertinent negatives include no headaches.    Past Medical History:  Diagnosis Date  . GERD (gastroesophageal reflux disease)   . Hyperlipidemia   . Hypertension     There are no active problems to display for this patient.   History reviewed. No pertinent surgical history.   OB History   No obstetric history on file.      Home Medications    Prior to Admission medications   Medication Sig Start Date End Date Taking? Authorizing Provider  amLODipine (NORVASC) 5 MG tablet Take 5 mg by mouth daily. 01/30/19   [provider]  aspirin EC 81 MG tablet Take 81 mg by mouth daily.    [provider]  benazepril (LOTENSIN) 40 MG tablet Take 40 mg by mouth daily. 01/30/19   [provider]  omega-3 acid ethyl esters (LOVAZA) 1 g capsule Take 2 g by mouth daily.    [provider]  omeprazole (PRILOSEC) 20 MG capsule Take 20 mg by mouth daily. 02/05/19   [provider]  ondansetron (ZOFRAN ODT) 4 MG disintegrating tablet Take 1 tablet (4 mg total) by mouth every 8 (eight) hours as needed. 02/17/19   McDonald, Mia A, PA-C  pravastatin (PRAVACHOL) 20 MG tablet Take 20 mg by mouth every morning. 01/25/19   [provider]    Family History No family history on  file.  Social History Social History   Tobacco Use  . Smoking status: Never Smoker  . Smokeless tobacco: Never Used  Substance Use Topics  . Alcohol use: Never    Frequency: Never  . Drug use: Never     Allergies   Sulfa antibiotics   Review of Systems Review of Systems  Constitutional: Negative for fever.  Skin: Negative for pallor and wound.  Neurological: Negative for headaches.     Physical Exam Updated Vital Signs BP 137/63   Pulse 92   Temp 98.4 F (36.9 C) (Oral)   Resp 16   SpO2 97%   Physical Exam Vitals signs and nursing note reviewed.  Constitutional:      Appearance: Normal appearance.  HENT:     Head: Normocephalic.      Comments: Patient does have bruising noted to right side of her face, reports improvement since her fall. Cardiovascular:     Rate and Rhythm: Normal rate.  Pulmonary:     Effort: Pulmonary effort is normal.  Abdominal:     Palpations: Abdomen is soft.  Skin:    General: Skin is warm.  Neurological:     Mental Status: She is alert and oriented to person, place, and time.      ED Treatments / Results  Labs (all labs ordered  are listed, but only abnormal results are displayed) Labs Reviewed - No data to display  EKG None  Radiology No results found.  Procedures Procedures (including critical care time)  Medications Ordered in ED Medications - No data to display   Initial Impression / Assessment and Plan / ED Course  I have reviewed the triage vital signs and the nursing notes.  Pertinent labs & imaging results that were available during my care of the patient were reviewed by me and considered in my medical decision making (see chart for details).      Patient with an extensive medical history presents to the ED for staple removal, upon evaluation wound is well-appearing, she reports no complaints.  Denies any fevers, erythema, drainage from the wound.  Patient did have the staples placed about a week  ago, has not had any concerns with them and would like them removed at this time.  Vital signs are otherwise within normal limits.  Patient stable for discharge.    Portions of this note were generated with [employer]. Dictation errors may occur despite best attempts at proofreading.  Final Clinical Impressions(s) / ED Diagnoses   Final diagnoses:  Removal of staples    ED Discharge Orders    None       Janeece Fitting, Hershal Coria 06/25/19 2209    Fredia Sorrow, MD 07/04/19 1157

## 2019-07-05 DIAGNOSIS — R69 Illness, unspecified: Secondary | ICD-10-CM | POA: Diagnosis not present

## 2019-07-28 DIAGNOSIS — I10 Essential (primary) hypertension: Secondary | ICD-10-CM | POA: Diagnosis not present

## 2019-07-28 DIAGNOSIS — Z6826 Body mass index (BMI) 26.0-26.9, adult: Secondary | ICD-10-CM | POA: Diagnosis not present

## 2019-07-28 DIAGNOSIS — D329 Benign neoplasm of meninges, unspecified: Secondary | ICD-10-CM | POA: Diagnosis not present

## 2019-08-26 DIAGNOSIS — Z6832 Body mass index (BMI) 32.0-32.9, adult: Secondary | ICD-10-CM | POA: Diagnosis not present

## 2019-08-26 DIAGNOSIS — L821 Other seborrheic keratosis: Secondary | ICD-10-CM | POA: Diagnosis not present

## 2019-08-26 DIAGNOSIS — Z0000 Encounter for general adult medical examination without abnormal findings: Secondary | ICD-10-CM | POA: Diagnosis not present

## 2019-08-26 DIAGNOSIS — Z1331 Encounter for screening for depression: Secondary | ICD-10-CM | POA: Diagnosis not present

## 2019-09-15 DIAGNOSIS — Z6829 Body mass index (BMI) 29.0-29.9, adult: Secondary | ICD-10-CM | POA: Diagnosis not present

## 2019-09-15 DIAGNOSIS — R300 Dysuria: Secondary | ICD-10-CM | POA: Diagnosis not present

## 2019-11-11 DIAGNOSIS — Z6830 Body mass index (BMI) 30.0-30.9, adult: Secondary | ICD-10-CM | POA: Diagnosis not present

## 2019-11-11 DIAGNOSIS — M7552 Bursitis of left shoulder: Secondary | ICD-10-CM | POA: Diagnosis not present

## 2020-01-01 DIAGNOSIS — R05 Cough: Secondary | ICD-10-CM | POA: Diagnosis not present

## 2020-01-01 DIAGNOSIS — Z6830 Body mass index (BMI) 30.0-30.9, adult: Secondary | ICD-10-CM | POA: Diagnosis not present

## 2020-01-01 DIAGNOSIS — Z20828 Contact with and (suspected) exposure to other viral communicable diseases: Secondary | ICD-10-CM | POA: Diagnosis not present

## 2020-01-06 DIAGNOSIS — Z008 Encounter for other general examination: Secondary | ICD-10-CM | POA: Diagnosis not present

## 2020-01-06 DIAGNOSIS — Z87891 Personal history of nicotine dependence: Secondary | ICD-10-CM | POA: Diagnosis not present

## 2020-01-06 DIAGNOSIS — R69 Illness, unspecified: Secondary | ICD-10-CM | POA: Diagnosis not present

## 2020-01-06 DIAGNOSIS — I2510 Atherosclerotic heart disease of native coronary artery without angina pectoris: Secondary | ICD-10-CM | POA: Diagnosis not present

## 2020-01-06 DIAGNOSIS — G3184 Mild cognitive impairment, so stated: Secondary | ICD-10-CM | POA: Diagnosis not present

## 2020-01-06 DIAGNOSIS — K219 Gastro-esophageal reflux disease without esophagitis: Secondary | ICD-10-CM | POA: Diagnosis not present

## 2020-01-06 DIAGNOSIS — E785 Hyperlipidemia, unspecified: Secondary | ICD-10-CM | POA: Diagnosis not present

## 2020-01-06 DIAGNOSIS — R32 Unspecified urinary incontinence: Secondary | ICD-10-CM | POA: Diagnosis not present

## 2020-01-06 DIAGNOSIS — Z803 Family history of malignant neoplasm of breast: Secondary | ICD-10-CM | POA: Diagnosis not present

## 2020-01-06 DIAGNOSIS — I10 Essential (primary) hypertension: Secondary | ICD-10-CM | POA: Diagnosis not present

## 2020-01-06 DIAGNOSIS — Z85828 Personal history of other malignant neoplasm of skin: Secondary | ICD-10-CM | POA: Diagnosis not present

## 2020-02-01 DIAGNOSIS — Z6830 Body mass index (BMI) 30.0-30.9, adult: Secondary | ICD-10-CM | POA: Diagnosis not present

## 2020-02-01 DIAGNOSIS — E7800 Pure hypercholesterolemia, unspecified: Secondary | ICD-10-CM | POA: Diagnosis not present

## 2020-02-01 DIAGNOSIS — Z1331 Encounter for screening for depression: Secondary | ICD-10-CM | POA: Diagnosis not present

## 2020-02-01 DIAGNOSIS — N39490 Overflow incontinence: Secondary | ICD-10-CM | POA: Diagnosis not present

## 2020-02-01 DIAGNOSIS — I10 Essential (primary) hypertension: Secondary | ICD-10-CM | POA: Diagnosis not present

## 2020-02-01 DIAGNOSIS — E559 Vitamin D deficiency, unspecified: Secondary | ICD-10-CM | POA: Diagnosis not present

## 2020-02-01 DIAGNOSIS — Z79899 Other long term (current) drug therapy: Secondary | ICD-10-CM | POA: Diagnosis not present

## 2020-06-05 IMAGING — CT CT HEAD WITHOUT CONTRAST
4 series · 15 of 47 positions shown, 17 images · non-contrast
Comparison: None.

CLINICAL DATA: Trip with fall down front steps, head strike to
concrete right scalp laceration

EXAM:
CT HEAD WITHOUT CONTRAST
CT CERVICAL SPINE WITHOUT CONTRAST
TECHNIQUE: Multidetector CT imaging of the head and cervical spine was
performed following the standard protocol without intravenous
contrast. Multiplanar CT image reconstructions of the cervical spine
were also generated.

[Series 4: head without · axial · non-contrast · 0.41mm/px · z∈[-108,+17]mm · 7 of 35 slices shown, 9 images]
[im 5/35  brain]
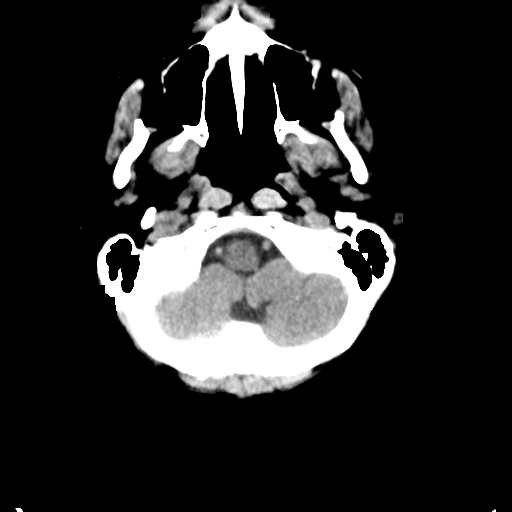
[im 5/35  bone]
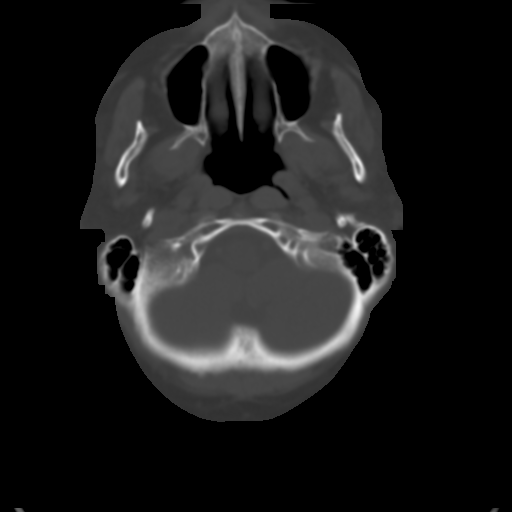
[im 9/35  brain]
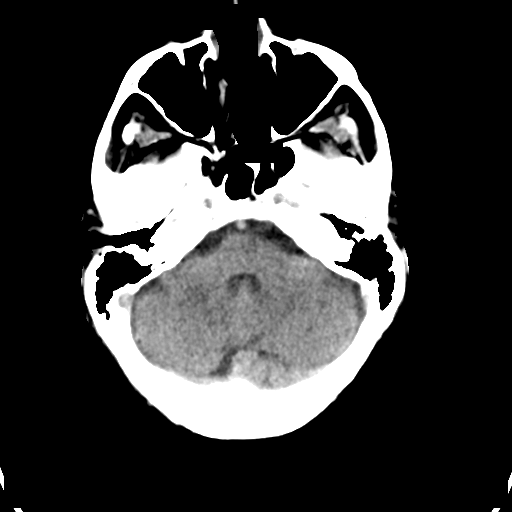
[im 13/35  brain]
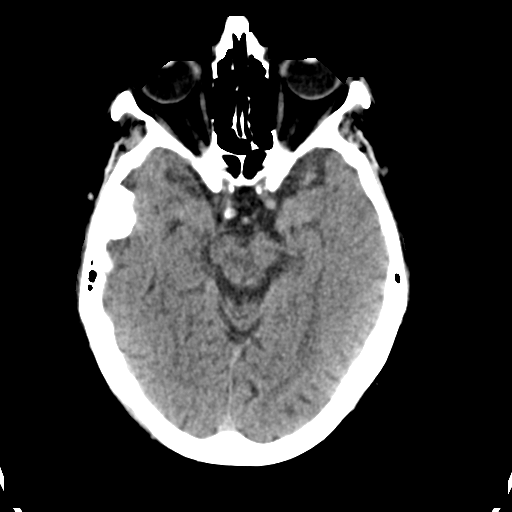
[im 18/35  brain]
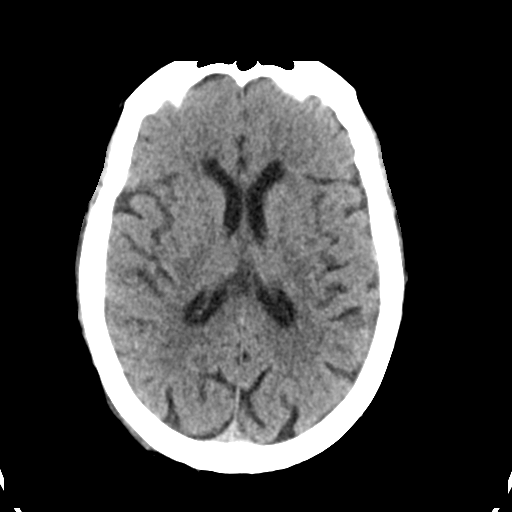
[im 22/35  brain]
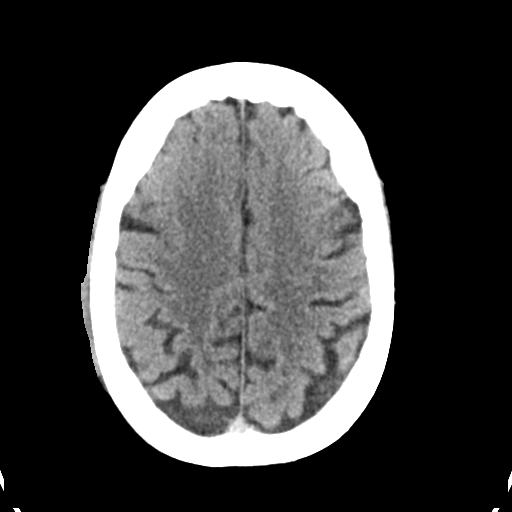
[im 22/35  bone]
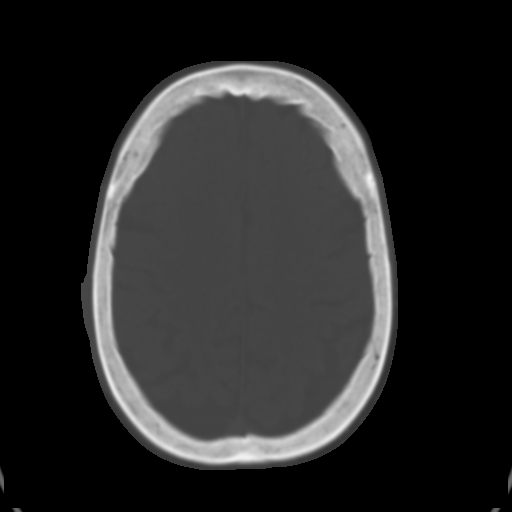
[im 26/35  brain]
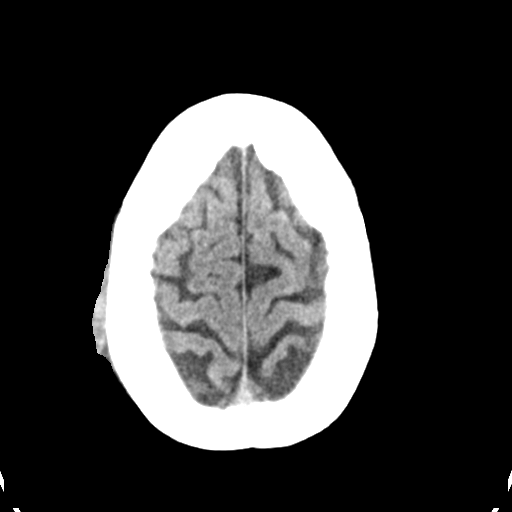
[im 30/35  brain]
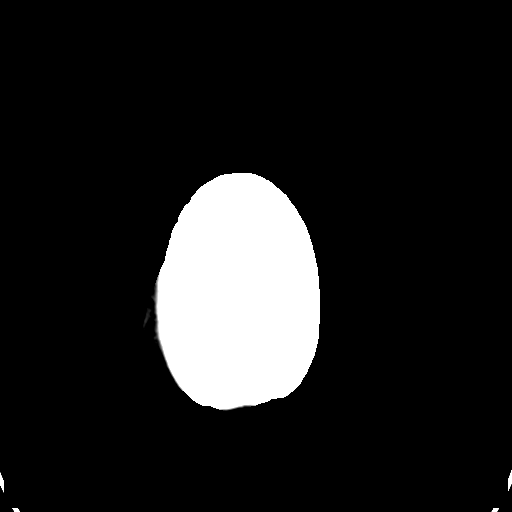

[Series 5: head bone · axial · 0.41mm/px · z∈[-112,-94]mm · 2 of 86 slices shown]
[im 9/86  bone]
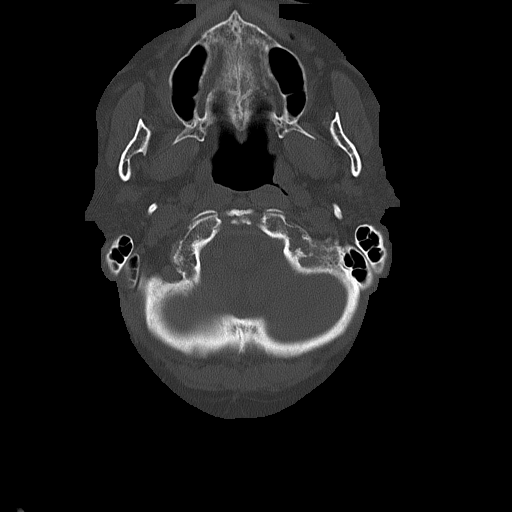
[im 18/86  bone]
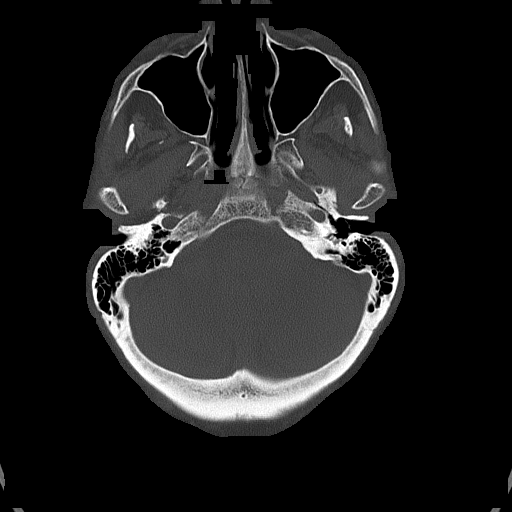

[Series 6: head without cor · coronal · non-contrast · 0.32mm/px · 3 of 72 slices shown]
[im 24/72  brain]
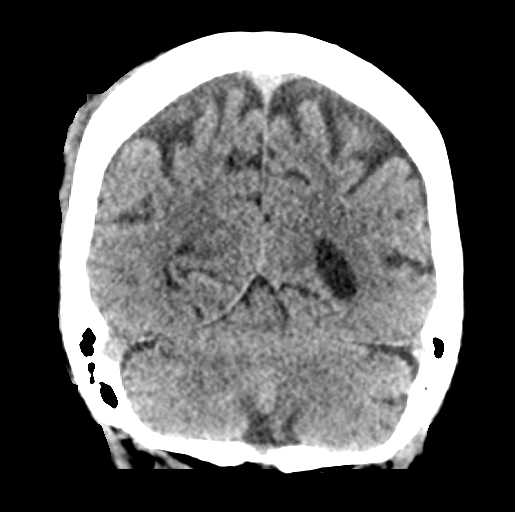
[im 32/72  brain]
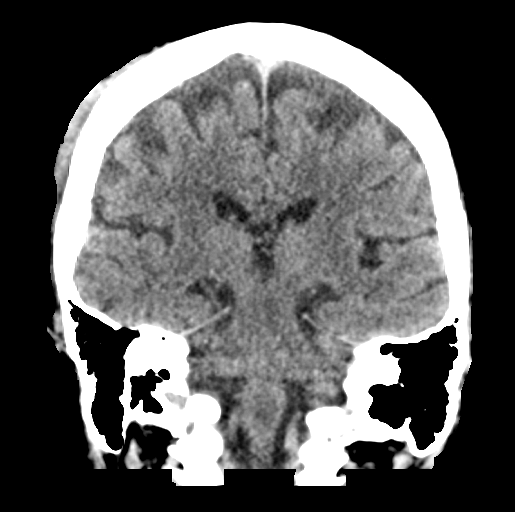
[im 40/72  brain]
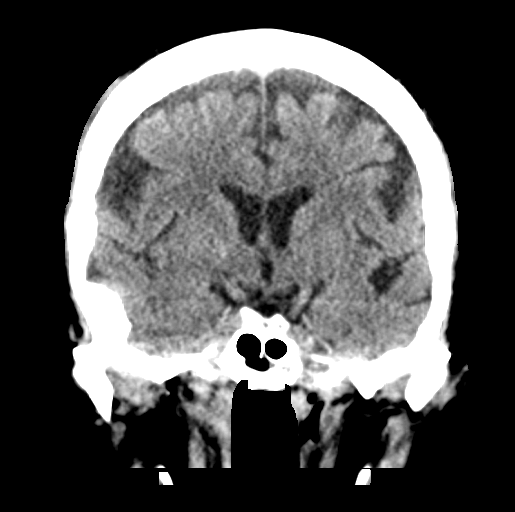

[Series 7: head without sag · sagittal · non-contrast · 0.31mm/px · 3 of 57 slices shown]
[im 19/57  brain]
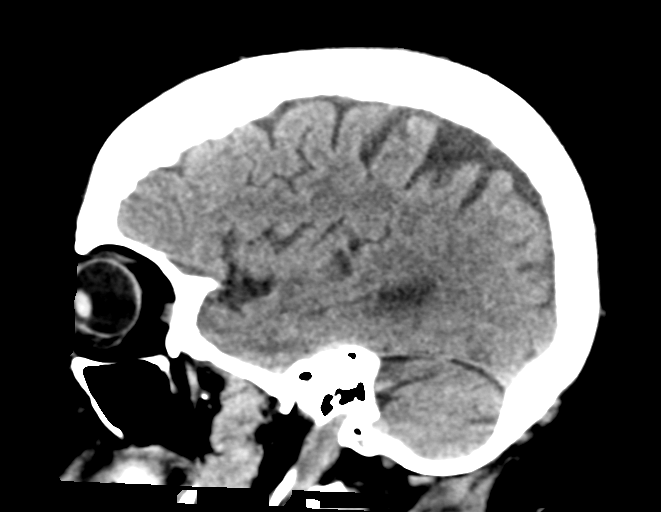
[im 29/57  brain]
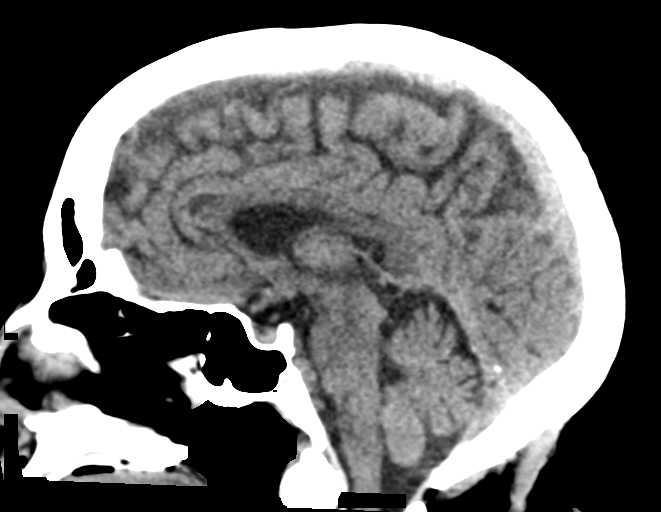
[im 38/57  brain]
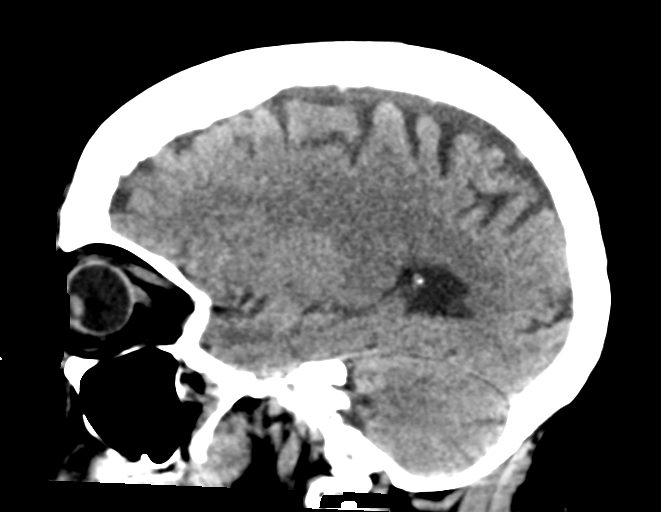

[15 of 47 positions shown; findings below may reference images not displayed]

FINDINGS: CT HEAD FINDINGS

Brain: No evidence of acute infarction, hemorrhage, hydrocephalus,
extra-axial collection.There is a focally sclerotic appearing lesion
arising from the inner table of the squamosal portion of the right
temporal bone which exerts minimal mass effect on the adjacent
brain.

Vascular: Atherosclerotic calcification of the carotid siphons and
intradural vertebral arteries.

Skull: There is focal scalp swelling and laceration with subgaleal
hematoma measuring up to 6 mm in thickness overlying the right
frontoparietal scalp without subjacent calvarial fracture.
Appearance is suggestive of a meningioma. Background of hyperostosis
frontalis interna within the calvaria

Sinuses/Orbits: Paranasal sinuses and mastoid air cells are
predominantly clear. Orbital structures are unremarkable.

Other: None.

CT CERVICAL SPINE FINDINGS

Alignment: Exaggeration of the normal cervical lordosis.
Degenerative anterolisthesis of C4 on C5, C6 on C7 and C7 on T1.

Skull base and vertebrae: No acute fracture. No primary bone lesion
or focal pathologic process. Vertebral body heights are maintained.

Soft tissues and spinal canal: No pre or paravertebral fluid or
swelling. No visible canal hematoma.

Disc levels: Diffuse intervertebral disc height loss is present
through the cervical spine. There resulting cervical spondylitic
endplate changes and hypertrophic facet degeneration. Posterior disc
osteophyte complexes are present at C3-4, C5-6 and C6-7 which efface
the ventral thecal sac. Osteophyte complex at C5-6 results in at
least mild spinal canal stenosis. No other significant spinal canal
stenosis. There is severe left and moderate right foraminal stenosis
at C3-4 with additional mild-to-moderate foraminal stenoses are
noted the remaining cervical levels.

Upper chest: Mild biapical pleuroparenchymal scarring. No acute
abnormality in the upper chest or imaged lung apices.

Other: Vascular calcifications of the great vessels and within the
carotid bifurcations.
IMPRESSION: 1. No acute intracranial abnormality.
2. Focal scalp swelling and laceration with subgaleal hematoma
overlying the right frontoparietal scalp without subjacent calvarial
fracture.
3. Fully calcified lesion along the inner table of the squamosal
portion of the right temporal bone likely reflects a calcified
meningioma with minimal local mass effect. Findings most compatible
with a meningioma.
4. No acute cervical spine fracture.
5. Multilevel degenerative changes through the cervical spine as
described above. Maximal changes at C3-4 with severe left neural
foraminal stenosis.

## 2020-06-17 DIAGNOSIS — Z6830 Body mass index (BMI) 30.0-30.9, adult: Secondary | ICD-10-CM | POA: Diagnosis not present

## 2020-06-17 DIAGNOSIS — L989 Disorder of the skin and subcutaneous tissue, unspecified: Secondary | ICD-10-CM | POA: Diagnosis not present

## 2020-06-19 DIAGNOSIS — I700 Atherosclerosis of aorta: Secondary | ICD-10-CM | POA: Diagnosis not present

## 2020-06-19 DIAGNOSIS — R531 Weakness: Secondary | ICD-10-CM | POA: Diagnosis not present

## 2020-06-19 DIAGNOSIS — B9689 Other specified bacterial agents as the cause of diseases classified elsewhere: Secondary | ICD-10-CM | POA: Diagnosis not present

## 2020-06-19 DIAGNOSIS — R000 Tachycardia, unspecified: Secondary | ICD-10-CM | POA: Diagnosis not present

## 2020-06-19 DIAGNOSIS — N390 Urinary tract infection, site not specified: Secondary | ICD-10-CM | POA: Diagnosis not present

## 2020-06-27 DIAGNOSIS — L578 Other skin changes due to chronic exposure to nonionizing radiation: Secondary | ICD-10-CM | POA: Diagnosis not present

## 2020-06-27 DIAGNOSIS — L821 Other seborrheic keratosis: Secondary | ICD-10-CM | POA: Diagnosis not present

## 2020-08-08 DIAGNOSIS — R350 Frequency of micturition: Secondary | ICD-10-CM | POA: Diagnosis not present

## 2020-08-08 DIAGNOSIS — M25561 Pain in right knee: Secondary | ICD-10-CM | POA: Diagnosis not present

## 2020-08-08 DIAGNOSIS — E785 Hyperlipidemia, unspecified: Secondary | ICD-10-CM | POA: Diagnosis not present

## 2020-08-08 DIAGNOSIS — E663 Overweight: Secondary | ICD-10-CM | POA: Diagnosis not present

## 2020-08-08 DIAGNOSIS — E559 Vitamin D deficiency, unspecified: Secondary | ICD-10-CM | POA: Diagnosis not present

## 2020-08-10 DIAGNOSIS — N3091 Cystitis, unspecified with hematuria: Secondary | ICD-10-CM | POA: Diagnosis not present

## 2020-08-10 DIAGNOSIS — R300 Dysuria: Secondary | ICD-10-CM | POA: Diagnosis not present

## 2020-08-10 DIAGNOSIS — N3001 Acute cystitis with hematuria: Secondary | ICD-10-CM | POA: Diagnosis not present

## 2020-08-24 DIAGNOSIS — R42 Dizziness and giddiness: Secondary | ICD-10-CM | POA: Diagnosis not present

## 2020-08-24 DIAGNOSIS — N3091 Cystitis, unspecified with hematuria: Secondary | ICD-10-CM | POA: Diagnosis not present

## 2020-08-24 DIAGNOSIS — N3000 Acute cystitis without hematuria: Secondary | ICD-10-CM | POA: Diagnosis not present

## 2020-08-24 DIAGNOSIS — Z20828 Contact with and (suspected) exposure to other viral communicable diseases: Secondary | ICD-10-CM | POA: Diagnosis not present

## 2020-10-18 DIAGNOSIS — E785 Hyperlipidemia, unspecified: Secondary | ICD-10-CM | POA: Diagnosis not present

## 2020-10-18 DIAGNOSIS — Z0000 Encounter for general adult medical examination without abnormal findings: Secondary | ICD-10-CM | POA: Diagnosis not present

## 2020-10-18 DIAGNOSIS — Z1331 Encounter for screening for depression: Secondary | ICD-10-CM | POA: Diagnosis not present

## 2020-10-18 DIAGNOSIS — R69 Illness, unspecified: Secondary | ICD-10-CM | POA: Diagnosis not present

## 2020-10-18 DIAGNOSIS — Z6830 Body mass index (BMI) 30.0-30.9, adult: Secondary | ICD-10-CM | POA: Diagnosis not present

## 2020-10-18 DIAGNOSIS — R413 Other amnesia: Secondary | ICD-10-CM | POA: Diagnosis not present

## 2020-10-18 DIAGNOSIS — Z79899 Other long term (current) drug therapy: Secondary | ICD-10-CM | POA: Diagnosis not present

## 2020-11-27 DIAGNOSIS — Z20828 Contact with and (suspected) exposure to other viral communicable diseases: Secondary | ICD-10-CM | POA: Diagnosis not present

## 2020-11-27 DIAGNOSIS — R051 Acute cough: Secondary | ICD-10-CM | POA: Diagnosis not present

## 2020-11-27 DIAGNOSIS — R070 Pain in throat: Secondary | ICD-10-CM | POA: Diagnosis not present

## 2020-11-27 DIAGNOSIS — R059 Cough, unspecified: Secondary | ICD-10-CM | POA: Diagnosis not present

## 2020-11-27 DIAGNOSIS — J209 Acute bronchitis, unspecified: Secondary | ICD-10-CM | POA: Diagnosis not present

## 2020-11-27 DIAGNOSIS — R0981 Nasal congestion: Secondary | ICD-10-CM | POA: Diagnosis not present

## 2021-02-07 DIAGNOSIS — Z87891 Personal history of nicotine dependence: Secondary | ICD-10-CM | POA: Diagnosis not present

## 2021-02-07 DIAGNOSIS — K219 Gastro-esophageal reflux disease without esophagitis: Secondary | ICD-10-CM | POA: Diagnosis not present

## 2021-02-07 DIAGNOSIS — I10 Essential (primary) hypertension: Secondary | ICD-10-CM | POA: Diagnosis not present

## 2021-02-07 DIAGNOSIS — E785 Hyperlipidemia, unspecified: Secondary | ICD-10-CM | POA: Diagnosis not present

## 2021-02-07 DIAGNOSIS — Z7982 Long term (current) use of aspirin: Secondary | ICD-10-CM | POA: Diagnosis not present

## 2021-02-07 DIAGNOSIS — H547 Unspecified visual loss: Secondary | ICD-10-CM | POA: Diagnosis not present

## 2021-05-17 DIAGNOSIS — Z6827 Body mass index (BMI) 27.0-27.9, adult: Secondary | ICD-10-CM | POA: Diagnosis not present

## 2021-05-17 DIAGNOSIS — G4710 Hypersomnia, unspecified: Secondary | ICD-10-CM | POA: Diagnosis not present

## 2021-10-30 DIAGNOSIS — R059 Cough, unspecified: Secondary | ICD-10-CM | POA: Diagnosis not present

## 2021-10-30 DIAGNOSIS — U071 COVID-19: Secondary | ICD-10-CM | POA: Diagnosis not present

## 2021-10-30 DIAGNOSIS — R509 Fever, unspecified: Secondary | ICD-10-CM | POA: Diagnosis not present

## 2021-11-08 DIAGNOSIS — Z6825 Body mass index (BMI) 25.0-25.9, adult: Secondary | ICD-10-CM | POA: Diagnosis not present

## 2021-11-08 DIAGNOSIS — J208 Acute bronchitis due to other specified organisms: Secondary | ICD-10-CM | POA: Diagnosis not present

## 2021-11-08 DIAGNOSIS — U071 COVID-19: Secondary | ICD-10-CM | POA: Diagnosis not present

## 2021-12-01 DIAGNOSIS — Z23 Encounter for immunization: Secondary | ICD-10-CM | POA: Diagnosis not present

## 2021-12-01 DIAGNOSIS — Z6825 Body mass index (BMI) 25.0-25.9, adult: Secondary | ICD-10-CM | POA: Diagnosis not present

## 2021-12-01 DIAGNOSIS — R69 Illness, unspecified: Secondary | ICD-10-CM | POA: Diagnosis not present

## 2021-12-01 DIAGNOSIS — Z79899 Other long term (current) drug therapy: Secondary | ICD-10-CM | POA: Diagnosis not present

## 2021-12-01 DIAGNOSIS — R413 Other amnesia: Secondary | ICD-10-CM | POA: Diagnosis not present

## 2021-12-01 DIAGNOSIS — Z0000 Encounter for general adult medical examination without abnormal findings: Secondary | ICD-10-CM | POA: Diagnosis not present

## 2021-12-01 DIAGNOSIS — E785 Hyperlipidemia, unspecified: Secondary | ICD-10-CM | POA: Diagnosis not present

## 2021-12-01 DIAGNOSIS — R7989 Other specified abnormal findings of blood chemistry: Secondary | ICD-10-CM | POA: Diagnosis not present

## 2021-12-01 DIAGNOSIS — Z1331 Encounter for screening for depression: Secondary | ICD-10-CM | POA: Diagnosis not present

## 2021-12-01 DIAGNOSIS — I10 Essential (primary) hypertension: Secondary | ICD-10-CM | POA: Diagnosis not present

## 2021-12-04 DIAGNOSIS — E785 Hyperlipidemia, unspecified: Secondary | ICD-10-CM | POA: Diagnosis not present

## 2021-12-04 DIAGNOSIS — R32 Unspecified urinary incontinence: Secondary | ICD-10-CM | POA: Diagnosis not present

## 2021-12-04 DIAGNOSIS — M810 Age-related osteoporosis without current pathological fracture: Secondary | ICD-10-CM | POA: Diagnosis not present

## 2021-12-04 DIAGNOSIS — J309 Allergic rhinitis, unspecified: Secondary | ICD-10-CM | POA: Diagnosis not present

## 2021-12-04 DIAGNOSIS — I10 Essential (primary) hypertension: Secondary | ICD-10-CM | POA: Diagnosis not present

## 2021-12-04 DIAGNOSIS — Z008 Encounter for other general examination: Secondary | ICD-10-CM | POA: Diagnosis not present

## 2021-12-04 DIAGNOSIS — Z7722 Contact with and (suspected) exposure to environmental tobacco smoke (acute) (chronic): Secondary | ICD-10-CM | POA: Diagnosis not present

## 2021-12-04 DIAGNOSIS — R69 Illness, unspecified: Secondary | ICD-10-CM | POA: Diagnosis not present

## 2021-12-04 DIAGNOSIS — Z803 Family history of malignant neoplasm of breast: Secondary | ICD-10-CM | POA: Diagnosis not present

## 2021-12-04 DIAGNOSIS — K219 Gastro-esophageal reflux disease without esophagitis: Secondary | ICD-10-CM | POA: Diagnosis not present

## 2021-12-04 DIAGNOSIS — I700 Atherosclerosis of aorta: Secondary | ICD-10-CM | POA: Diagnosis not present

## 2021-12-04 DIAGNOSIS — I2510 Atherosclerotic heart disease of native coronary artery without angina pectoris: Secondary | ICD-10-CM | POA: Diagnosis not present

## 2021-12-04 DIAGNOSIS — H547 Unspecified visual loss: Secondary | ICD-10-CM | POA: Diagnosis not present

## 2022-02-16 DIAGNOSIS — L259 Unspecified contact dermatitis, unspecified cause: Secondary | ICD-10-CM | POA: Diagnosis not present

## 2022-02-16 DIAGNOSIS — Z6827 Body mass index (BMI) 27.0-27.9, adult: Secondary | ICD-10-CM | POA: Diagnosis not present

## 2022-02-16 DIAGNOSIS — S39012A Strain of muscle, fascia and tendon of lower back, initial encounter: Secondary | ICD-10-CM | POA: Diagnosis not present

## 2022-12-20 DIAGNOSIS — I10 Essential (primary) hypertension: Secondary | ICD-10-CM | POA: Diagnosis not present

## 2022-12-20 DIAGNOSIS — Z6825 Body mass index (BMI) 25.0-25.9, adult: Secondary | ICD-10-CM | POA: Diagnosis not present

## 2022-12-20 DIAGNOSIS — R7989 Other specified abnormal findings of blood chemistry: Secondary | ICD-10-CM | POA: Diagnosis not present

## 2022-12-20 DIAGNOSIS — Z1331 Encounter for screening for depression: Secondary | ICD-10-CM | POA: Diagnosis not present

## 2022-12-20 DIAGNOSIS — E785 Hyperlipidemia, unspecified: Secondary | ICD-10-CM | POA: Diagnosis not present

## 2022-12-20 DIAGNOSIS — Z0000 Encounter for general adult medical examination without abnormal findings: Secondary | ICD-10-CM | POA: Diagnosis not present

## 2022-12-20 DIAGNOSIS — R413 Other amnesia: Secondary | ICD-10-CM | POA: Diagnosis not present

## 2023-03-08 DIAGNOSIS — Z803 Family history of malignant neoplasm of breast: Secondary | ICD-10-CM | POA: Diagnosis not present

## 2023-03-08 DIAGNOSIS — R32 Unspecified urinary incontinence: Secondary | ICD-10-CM | POA: Diagnosis not present

## 2023-03-08 DIAGNOSIS — J309 Allergic rhinitis, unspecified: Secondary | ICD-10-CM | POA: Diagnosis not present

## 2023-03-08 DIAGNOSIS — Z8249 Family history of ischemic heart disease and other diseases of the circulatory system: Secondary | ICD-10-CM | POA: Diagnosis not present

## 2023-03-08 DIAGNOSIS — M810 Age-related osteoporosis without current pathological fracture: Secondary | ICD-10-CM | POA: Diagnosis not present

## 2023-03-08 DIAGNOSIS — F03A0 Unspecified dementia, mild, without behavioral disturbance, psychotic disturbance, mood disturbance, and anxiety: Secondary | ICD-10-CM | POA: Diagnosis not present

## 2023-03-08 DIAGNOSIS — Z87891 Personal history of nicotine dependence: Secondary | ICD-10-CM | POA: Diagnosis not present

## 2023-03-08 DIAGNOSIS — K219 Gastro-esophageal reflux disease without esophagitis: Secondary | ICD-10-CM | POA: Diagnosis not present

## 2023-03-08 DIAGNOSIS — Z85828 Personal history of other malignant neoplasm of skin: Secondary | ICD-10-CM | POA: Diagnosis not present

## 2023-03-08 DIAGNOSIS — I10 Essential (primary) hypertension: Secondary | ICD-10-CM | POA: Diagnosis not present

## 2023-03-08 DIAGNOSIS — I700 Atherosclerosis of aorta: Secondary | ICD-10-CM | POA: Diagnosis not present

## 2023-03-08 DIAGNOSIS — E785 Hyperlipidemia, unspecified: Secondary | ICD-10-CM | POA: Diagnosis not present

## 2023-03-27 DIAGNOSIS — R300 Dysuria: Secondary | ICD-10-CM | POA: Diagnosis not present

## 2023-09-09 DIAGNOSIS — R300 Dysuria: Secondary | ICD-10-CM | POA: Diagnosis not present

## 2023-11-11 DIAGNOSIS — Z008 Encounter for other general examination: Secondary | ICD-10-CM | POA: Diagnosis not present

## 2024-01-16 DIAGNOSIS — N3000 Acute cystitis without hematuria: Secondary | ICD-10-CM | POA: Diagnosis not present

## 2024-02-29 DIAGNOSIS — R300 Dysuria: Secondary | ICD-10-CM | POA: Diagnosis not present

## 2024-03-13 DIAGNOSIS — R300 Dysuria: Secondary | ICD-10-CM | POA: Diagnosis not present

## 2024-04-03 DIAGNOSIS — R300 Dysuria: Secondary | ICD-10-CM | POA: Diagnosis not present

## 2024-06-01 DIAGNOSIS — N8111 Cystocele, midline: Secondary | ICD-10-CM | POA: Diagnosis not present

## 2024-06-01 DIAGNOSIS — N3020 Other chronic cystitis without hematuria: Secondary | ICD-10-CM | POA: Diagnosis not present

## 2024-06-01 DIAGNOSIS — N952 Postmenopausal atrophic vaginitis: Secondary | ICD-10-CM | POA: Diagnosis not present

## 2024-09-08 DIAGNOSIS — H2513 Age-related nuclear cataract, bilateral: Secondary | ICD-10-CM | POA: Diagnosis not present

## 2024-09-08 DIAGNOSIS — H2511 Age-related nuclear cataract, right eye: Secondary | ICD-10-CM | POA: Diagnosis not present

## 2024-09-08 DIAGNOSIS — H18413 Arcus senilis, bilateral: Secondary | ICD-10-CM | POA: Diagnosis not present

## 2024-09-08 DIAGNOSIS — H25013 Cortical age-related cataract, bilateral: Secondary | ICD-10-CM | POA: Diagnosis not present

## 2024-09-08 DIAGNOSIS — H353134 Nonexudative age-related macular degeneration, bilateral, advanced atrophic with subfoveal involvement: Secondary | ICD-10-CM | POA: Diagnosis not present

## 2024-10-26 DIAGNOSIS — N390 Urinary tract infection, site not specified: Secondary | ICD-10-CM | POA: Diagnosis not present

## 2024-10-26 DIAGNOSIS — Z79899 Other long term (current) drug therapy: Secondary | ICD-10-CM | POA: Diagnosis not present

## 2024-10-26 DIAGNOSIS — J069 Acute upper respiratory infection, unspecified: Secondary | ICD-10-CM | POA: Diagnosis not present

## 2024-10-26 DIAGNOSIS — Z7982 Long term (current) use of aspirin: Secondary | ICD-10-CM | POA: Diagnosis not present

## 2024-10-26 DIAGNOSIS — K8020 Calculus of gallbladder without cholecystitis without obstruction: Secondary | ICD-10-CM | POA: Diagnosis not present

## 2024-10-26 DIAGNOSIS — Z87440 Personal history of urinary (tract) infections: Secondary | ICD-10-CM | POA: Diagnosis not present

## 2024-10-26 DIAGNOSIS — R0602 Shortness of breath: Secondary | ICD-10-CM | POA: Diagnosis not present
